# Patient Record
Sex: Male | Born: 1980 | Race: Black or African American | Hispanic: No | Marital: Single | Smoking: Current every day smoker
Health system: Southern US, Community
[De-identification: ages and names within clinical notes are randomized; demographics above are authoritative.]

## PROBLEM LIST (undated history)

## (undated) DIAGNOSIS — IMO0002 Reserved for concepts with insufficient information to code with codable children: Secondary | ICD-10-CM

---

## 2014-02-08 DIAGNOSIS — S98139A Complete traumatic amputation of one unspecified lesser toe, initial encounter: Secondary | ICD-10-CM | POA: Insufficient documentation

## 2014-02-08 DIAGNOSIS — M79609 Pain in unspecified limb: Secondary | ICD-10-CM | POA: Insufficient documentation

## 2014-02-08 DIAGNOSIS — Y835 Amputation of limb(s) as the cause of abnormal reaction of the patient, or of later complication, without mention of misadventure at the time of the procedure: Secondary | ICD-10-CM | POA: Diagnosis not present

## 2014-02-08 DIAGNOSIS — F17200 Nicotine dependence, unspecified, uncomplicated: Secondary | ICD-10-CM | POA: Insufficient documentation

## 2014-02-08 DIAGNOSIS — Z880 Allergy status to penicillin: Secondary | ICD-10-CM | POA: Insufficient documentation

## 2014-02-08 DIAGNOSIS — Z87828 Personal history of other (healed) physical injury and trauma: Secondary | ICD-10-CM | POA: Diagnosis not present

## 2014-02-08 DIAGNOSIS — T8140XA Infection following a procedure, unspecified, initial encounter: Secondary | ICD-10-CM | POA: Diagnosis not present

## 2014-02-09 ENCOUNTER — Emergency Department (HOSPITAL_COMMUNITY)
Admission: EM | Admit: 2014-02-09 | Discharge: 2014-02-09 | Disposition: A | Discharge status: Home / Self Care | Payer: Worker's Compensation | Attending: Emergency Medicine | Admitting: Emergency Medicine

## 2014-02-09 ENCOUNTER — Encounter (HOSPITAL_COMMUNITY): Payer: Self-pay | Admitting: Emergency Medicine

## 2014-02-09 ENCOUNTER — Emergency Department (HOSPITAL_COMMUNITY): Payer: Worker's Compensation

## 2014-02-09 DIAGNOSIS — T798XXA Other early complications of trauma, initial encounter: Secondary | ICD-10-CM

## 2014-02-09 HISTORY — DX: Reserved for concepts with insufficient information to code with codable children: IMO0002

## 2014-02-09 MED ORDER — OXYCODONE-ACETAMINOPHEN 5-325 MG PO TABS
2.0000 | ORAL_TABLET | Freq: Once | ORAL | Status: AC
  Administered 2014-02-09: 2 via ORAL
  Filled 2014-02-09: qty 2

## 2014-02-09 MED ORDER — CLINDAMYCIN HCL 300 MG PO CAPS
300.0000 mg | ORAL_CAPSULE | Freq: Once | ORAL | Status: AC
  Administered 2014-02-09: 300 mg via ORAL
  Filled 2014-02-09: qty 1

## 2014-02-09 MED ORDER — CLINDAMYCIN HCL 300 MG PO CAPS: 300.0000 mg | ORAL_CAPSULE | Freq: Four times a day (QID) | ORAL | Status: AC

## 2014-02-09 MED ORDER — OXYCODONE-ACETAMINOPHEN 5-325 MG PO TABS: 1.0000 | ORAL_TABLET | Freq: Four times a day (QID) | ORAL | Status: AC | PRN

## 2014-02-09 NOTE — ED Provider Notes (Signed)
Medical screening examination/treatment/procedure(s) were performed by non-physician practitioner and as supervising physician I was immediately available for consultation/collaboration.   EKG Interpretation None       Ethelda Chick, MD 02/09/14 [phone]

## 2014-02-09 NOTE — Discharge Instructions (Signed)
Please followup with a primary care provider or wound clinic for continued evaluation and treatment of your infected wound.    Wound Infection A wound infection happens when a type of germ (bacteria) starts growing in the wound. In some cases, this can cause the wound to break open. If cared for properly, the infected wound will heal from the inside to the outside. Wound infections need treatment. CAUSES An infection is caused by bacteria growing in the wound.  SYMPTOMS   Increase in redness, swelling, or pain at the wound site.  Increase in drainage at the wound site.  Wound or bandage (dressing) starts to smell bad.  Fever.  Feeling tired or fatigued.  Pus draining from the wound. TREATMENT  Your health care provider will prescribe antibiotic medicine. The wound infection should improve within 24 to 48 hours. Any redness around the wound should stop spreading and the wound should be less painful.  HOME CARE INSTRUCTIONS   Only take over-the-counter or prescription medicines for pain, discomfort, or fever as directed by your health care provider.  Take your antibiotics as directed. Finish them even if you start to feel better.  Gently wash the area with mild soap and water 2 times a day, or as directed. Rinse off the soap. Pat the area dry with a clean towel. Do not rub the wound. This may cause bleeding.  Follow your health care provider's instructions for how often you need to change the dressing.  Apply ointment and a dressing to the wound as directed.  If the dressing sticks, moisten it with soapy water and gently remove it.  Change the bandage right away if it becomes wet, dirty, or develops a bad smell.  Take showers. Do not take tub baths, swim, or do anything that may soak the wound until it is healed.  Avoid exercises that make you sweat heavily.  Use anti-itch medicine as directed by your health care provider. The wound may itch when it is healing. Do not pick or  scratch at the wound.  Follow up with your health care provider to get your wound rechecked as directed. SEEK MEDICAL CARE IF:  You have an increase in swelling, pain, or redness around the wound.  You have an increase in the amount of pus coming from the wound.  There is a bad smell coming from the wound.  More of the wound breaks open.  You have a fever. MAKE SURE YOU:   Understand these instructions.  Will watch your condition.  Will get help right away if you are not doing well or get worse. Document Released: 02/18/2003 Document Revised: 05/27/2013 Document Reviewed: 09/25/2010 [facility] Patient Information 2015 [place], [place]. This information is not intended to replace advice given to you by your health care provider. Make sure you discuss any questions you have with your health care provider.
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
## 2014-02-09 NOTE — ED Notes (Addendum)
Pt arrived to the ED with a complaint of left big toe pain.  Pt states he had the toe partially amputated in 2013.  Pt states the pain started to increase today.  Pt states that he had blood on the toe this afternoon.

## 2014-02-17 ENCOUNTER — Encounter (HOSPITAL_BASED_OUTPATIENT_CLINIC_OR_DEPARTMENT_OTHER): Payer: Worker's Compensation | Attending: General Surgery

## 2014-02-17 DIAGNOSIS — L97509 Non-pressure chronic ulcer of other part of unspecified foot with unspecified severity: Secondary | ICD-10-CM | POA: Diagnosis present

## 2014-02-17 DIAGNOSIS — G5790 Unspecified mononeuropathy of unspecified lower limb: Secondary | ICD-10-CM | POA: Diagnosis not present
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
## 2014-02-24 DIAGNOSIS — L97509 Non-pressure chronic ulcer of other part of unspecified foot with unspecified severity: Secondary | ICD-10-CM | POA: Diagnosis not present

## 2014-02-24 DIAGNOSIS — G5790 Unspecified mononeuropathy of unspecified lower limb: Secondary | ICD-10-CM | POA: Diagnosis not present

## 2014-03-03 DIAGNOSIS — L97509 Non-pressure chronic ulcer of other part of unspecified foot with unspecified severity: Secondary | ICD-10-CM | POA: Diagnosis not present

## 2014-03-03 DIAGNOSIS — G5790 Unspecified mononeuropathy of unspecified lower limb: Secondary | ICD-10-CM | POA: Diagnosis not present

## 2014-03-10 ENCOUNTER — Encounter (HOSPITAL_BASED_OUTPATIENT_CLINIC_OR_DEPARTMENT_OTHER): Payer: Worker's Compensation | Attending: General Surgery

## 2014-03-10 DIAGNOSIS — G629 Polyneuropathy, unspecified: Secondary | ICD-10-CM | POA: Diagnosis not present

## 2014-03-10 DIAGNOSIS — L97529 Non-pressure chronic ulcer of other part of left foot with unspecified severity: Secondary | ICD-10-CM | POA: Insufficient documentation

## 2014-03-17 DIAGNOSIS — G629 Polyneuropathy, unspecified: Secondary | ICD-10-CM | POA: Diagnosis not present

## 2014-03-17 DIAGNOSIS — L97529 Non-pressure chronic ulcer of other part of left foot with unspecified severity: Secondary | ICD-10-CM | POA: Diagnosis not present

## 2014-03-20 DIAGNOSIS — L97529 Non-pressure chronic ulcer of other part of left foot with unspecified severity: Secondary | ICD-10-CM | POA: Diagnosis not present

## 2014-03-20 DIAGNOSIS — G629 Polyneuropathy, unspecified: Secondary | ICD-10-CM | POA: Diagnosis not present

## 2014-03-24 DIAGNOSIS — L97529 Non-pressure chronic ulcer of other part of left foot with unspecified severity: Secondary | ICD-10-CM | POA: Diagnosis not present

## 2014-03-24 DIAGNOSIS — G629 Polyneuropathy, unspecified: Secondary | ICD-10-CM | POA: Diagnosis not present

## 2014-03-31 DIAGNOSIS — G629 Polyneuropathy, unspecified: Secondary | ICD-10-CM | POA: Diagnosis not present

## 2014-03-31 DIAGNOSIS — L97529 Non-pressure chronic ulcer of other part of left foot with unspecified severity: Secondary | ICD-10-CM | POA: Diagnosis not present

## 2015-07-08 IMAGING — CR DG TOE GREAT 2+V*L*
3 series · 3 of 3 positions shown · non-contrast
Comparison: None.

CLINICAL DATA: Ulcer on the posterior surface of the toe. Entire
toe is red, swelling, and painful.

EXAM:
LEFT GREAT TOE

[x toes ap left]
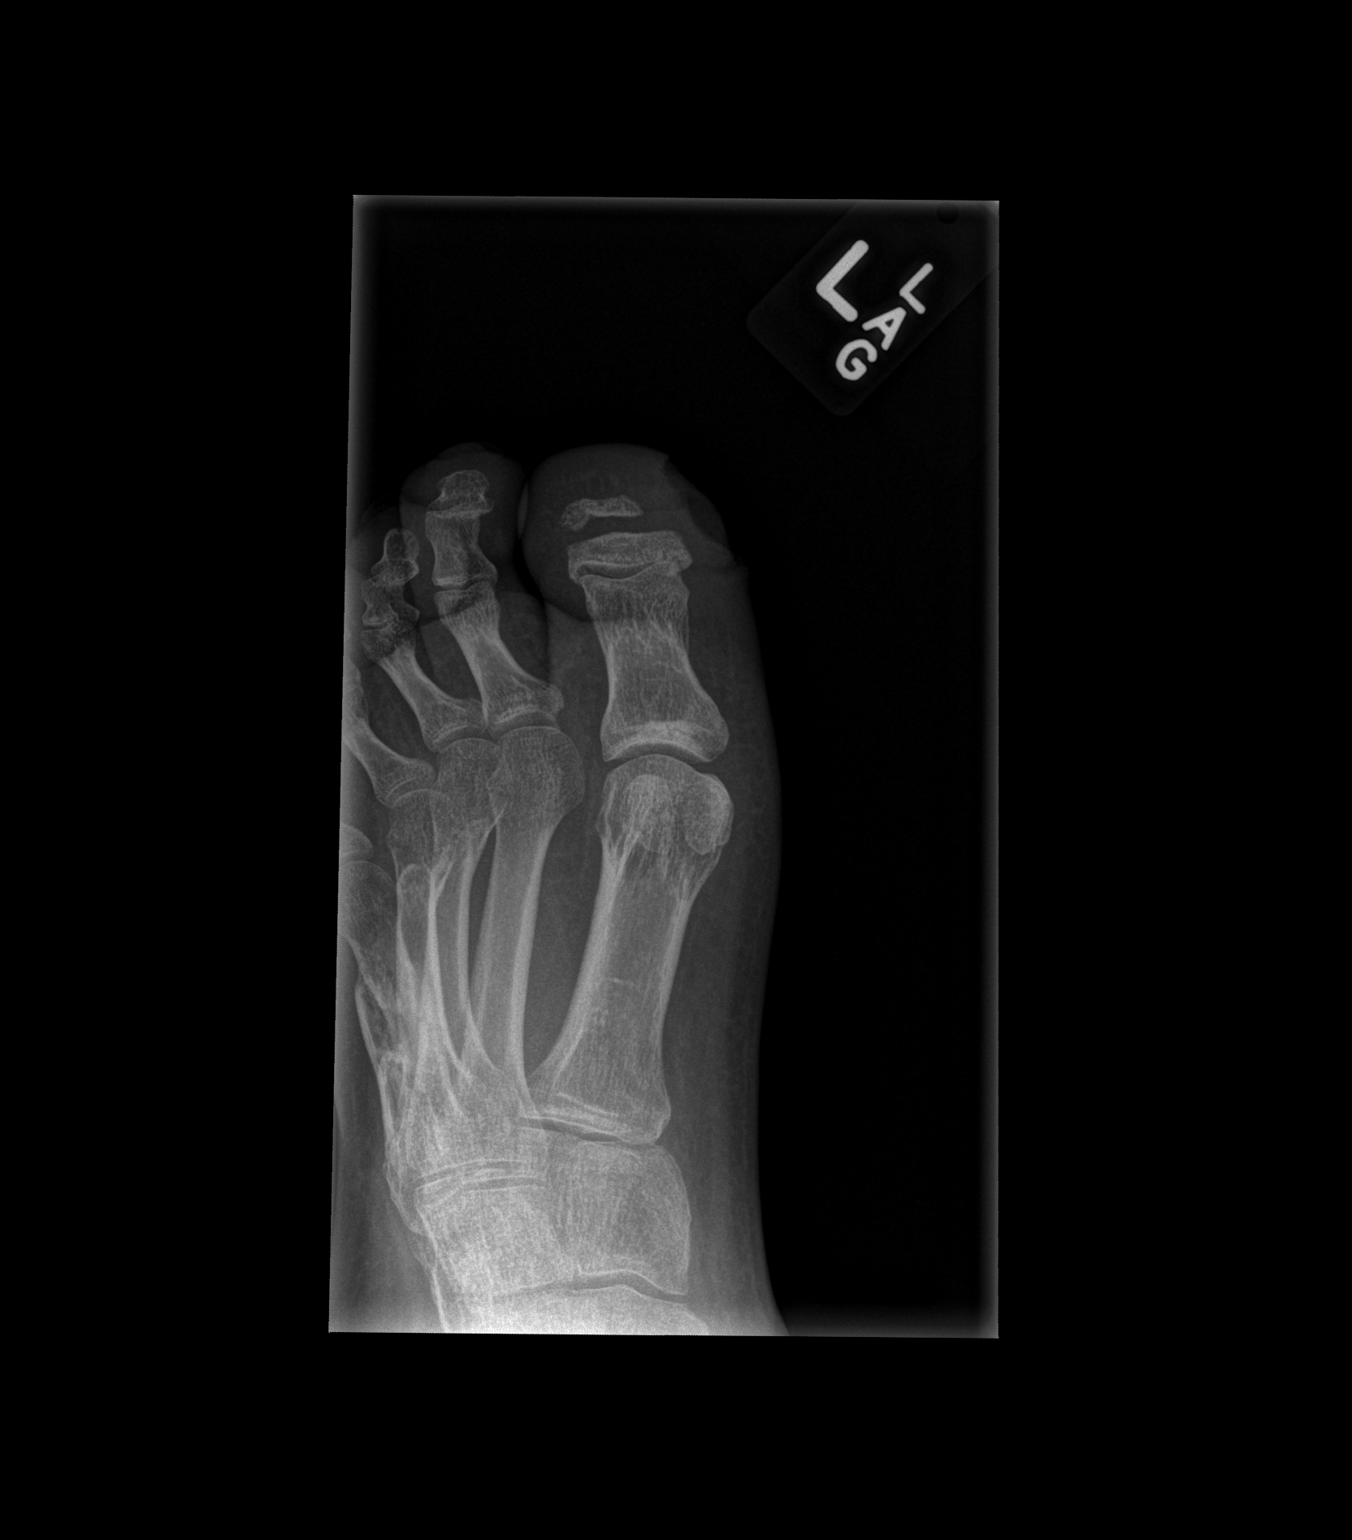

[x toes obl left]
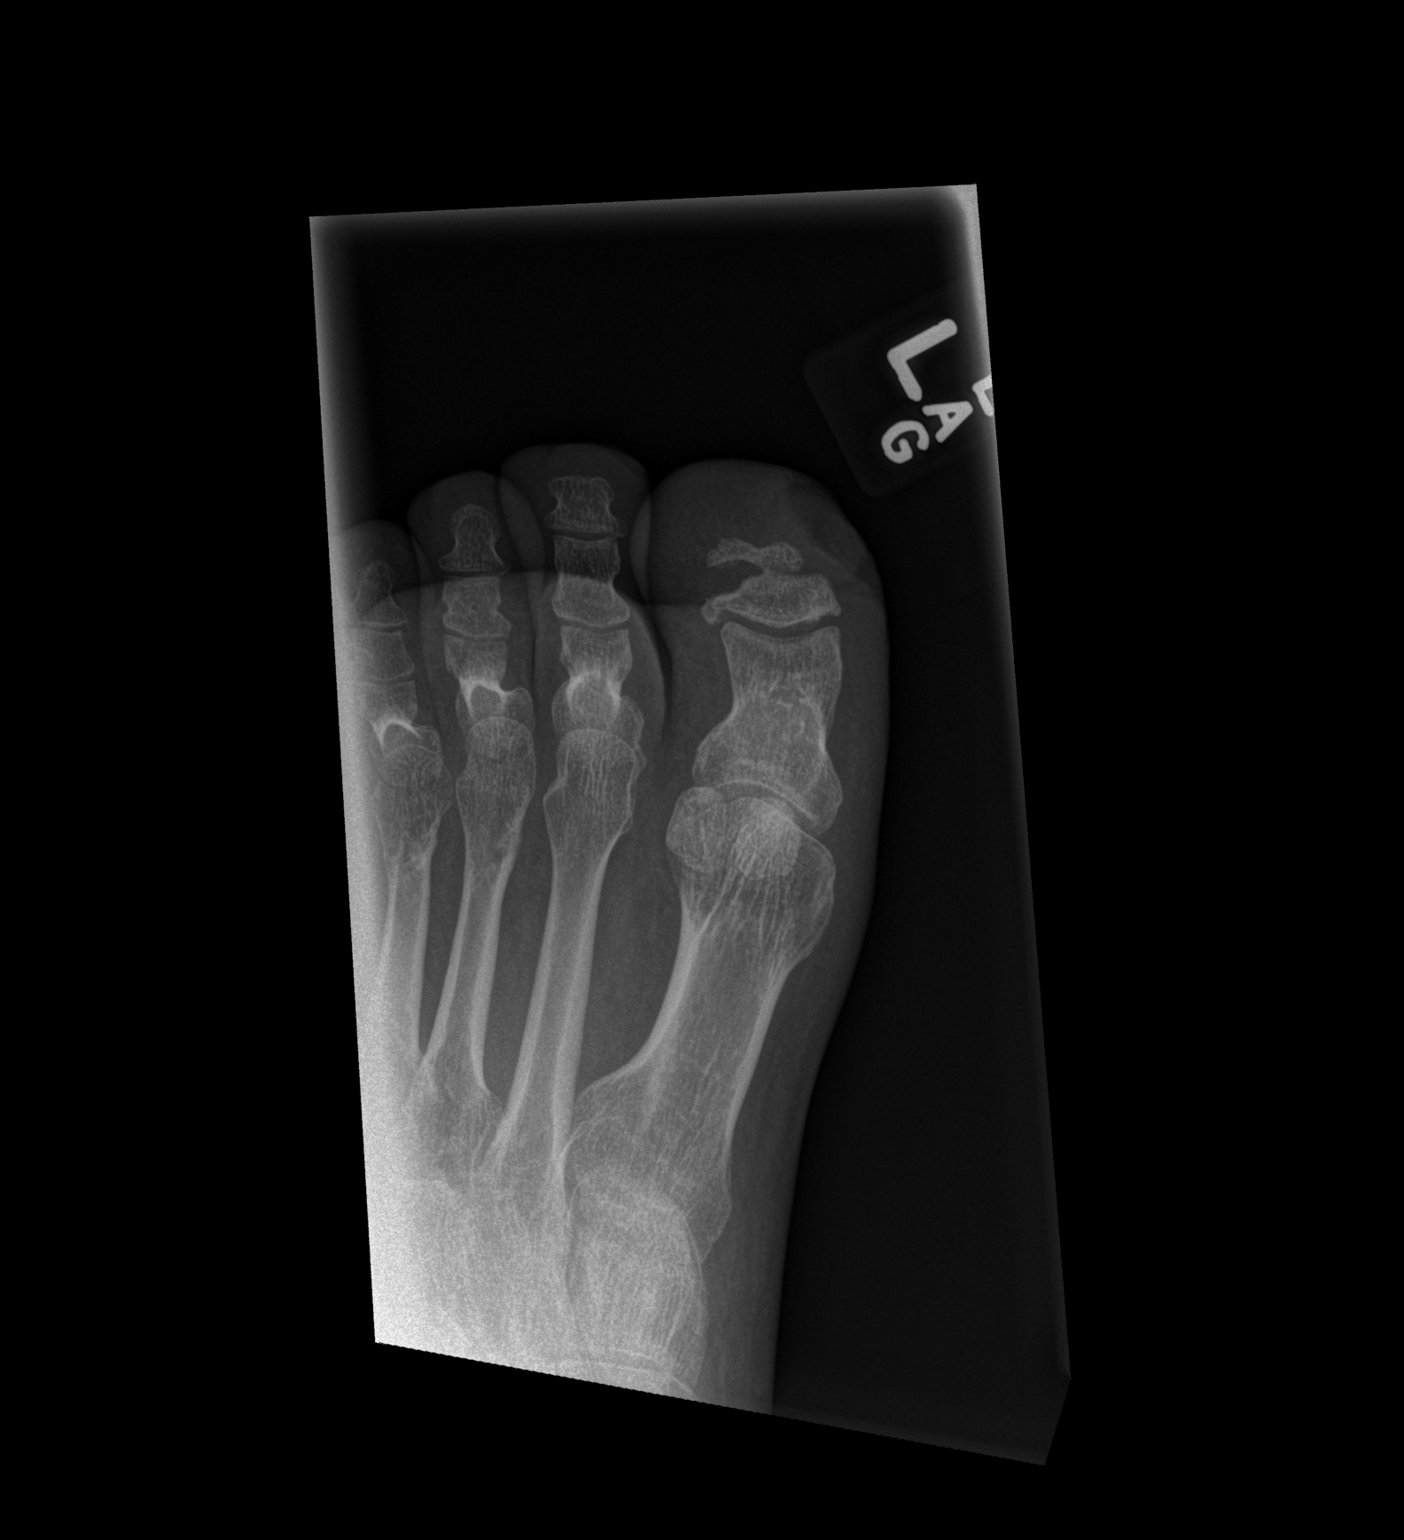

[x toes lat left]
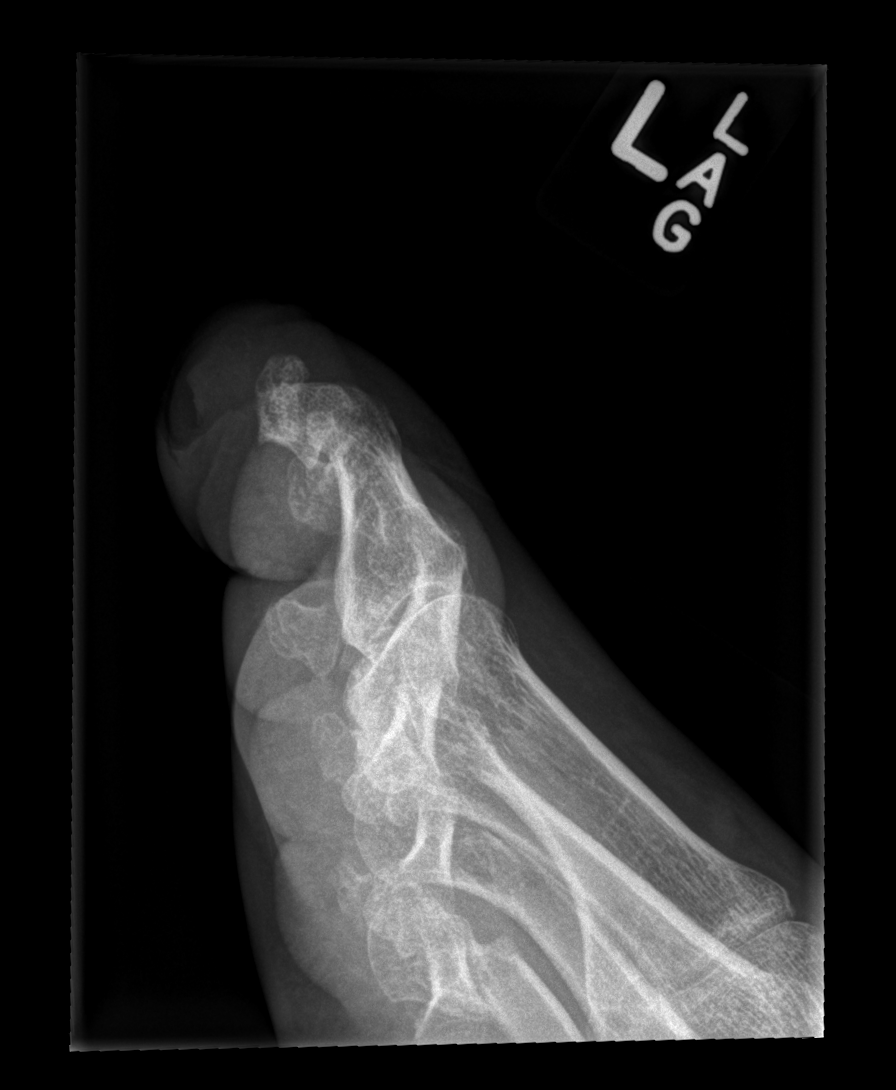

[3 of 3 positions shown; findings below may reference images not displayed]

FINDINGS: Soft tissue ulceration along the medial aspect of the left first
toe. Bone loss involving the midshaft of the distal phalanx with
displaced ununited ossicle. Margins appear well corticated
consistent with old injury or insult. No definite acute bone erosion
to suggest osteomyelitis. No radiopaque soft tissue foreign bodies.
Apparent old fracture deformities of the fourth and fifth metatarsal
bones.
IMPRESSION: Old ununited ossicle of the distal phalanx of the left first toe.
Old fracture deformities of the fourth and fifth metatarsal bones.
Soft tissue ulceration. No acute bony abnormalities.
